# Patient Record
Sex: Female | Born: 2000 | Hispanic: No | Marital: Single | Smoking: Never smoker
Health system: Southern US, Community
[De-identification: ages and names within clinical notes are randomized; demographics above are authoritative.]

## PROBLEM LIST (undated history)

## (undated) DIAGNOSIS — Z0101 Encounter for examination of eyes and vision with abnormal findings: Secondary | ICD-10-CM

## (undated) DIAGNOSIS — Z7289 Other problems related to lifestyle: Secondary | ICD-10-CM

## (undated) DIAGNOSIS — J390 Retropharyngeal and parapharyngeal abscess: Secondary | ICD-10-CM

## (undated) HISTORY — DX: Retropharyngeal and parapharyngeal abscess: J39.0

## (undated) HISTORY — DX: Encounter for examination of eyes and vision with abnormal findings: Z01.01

## (undated) HISTORY — DX: Other problems related to lifestyle: Z72.89

---
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
## 2020-01-07 DIAGNOSIS — H5213 Myopia, bilateral: Secondary | ICD-10-CM | POA: Diagnosis not present

## 2020-04-21 ENCOUNTER — Ambulatory Visit: Payer: Medicaid Other | Admitting: Pediatrics

## 2020-06-16 ENCOUNTER — Encounter: Payer: Self-pay | Admitting: Pediatrics

## 2020-06-16 ENCOUNTER — Other Ambulatory Visit (HOSPITAL_COMMUNITY)
Admission: RE | Admit: 2020-06-16 | Discharge: 2020-06-16 | Disposition: A | Discharge status: Home / Self Care | Payer: PPO | Source: Ambulatory Visit | Attending: Pediatrics | Admitting: Pediatrics

## 2020-06-16 ENCOUNTER — Other Ambulatory Visit: Payer: Self-pay

## 2020-06-16 ENCOUNTER — Ambulatory Visit (INDEPENDENT_AMBULATORY_CARE_PROVIDER_SITE_OTHER): Payer: PPO | Admitting: Pediatrics

## 2020-06-16 VITALS — BP 110/78 | Ht 58.86 in | Wt 124.0 lb

## 2020-06-16 DIAGNOSIS — Z68.41 Body mass index (BMI) pediatric, 5th percentile to less than 85th percentile for age: Secondary | ICD-10-CM

## 2020-06-16 DIAGNOSIS — Z0001 Encounter for general adult medical examination with abnormal findings: Secondary | ICD-10-CM

## 2020-06-16 DIAGNOSIS — M542 Cervicalgia: Secondary | ICD-10-CM

## 2020-06-16 DIAGNOSIS — Z113 Encounter for screening for infections with a predominantly sexual mode of transmission: Secondary | ICD-10-CM

## 2020-06-16 LAB — POCT RAPID HIV: Rapid HIV, POC: NEGATIVE

## 2020-06-16 NOTE — Patient Instructions (Addendum)
Dental list         Updated 11.20.18 These dentists all accept Medicaid.  The list is a courtesy and for your convenience. Estos dentistas aceptan Medicaid.  La lista es para su [place] y es una cortesa.     [facility]     [phone] [address]  [address] [place] [address] Se habla espaol From 73 to 19 years old Parent may go with child only for cleaning Vinson Moselle DDS     [phone] Milus Banister, DDS (Spanish speaking) [address] [place]  [address] Se habla espaol From 52 to 53 years old Parent may go with child   Marolyn Hammock DMD    [phone] [address] [place] [address] Se habla espaol [place] spoken From 72 years old Parent may go with child [facility]     [phone] [address] [place] [address] Se habla espaol From 55 to 29 years old Parent may NOT go with child  Winfield Rast DDS  [phone] [facility]      [address]  [place] [address] Se habla espaol [place] spoken (preferred to bring translator) From teeth coming in to 62 years old Parent may go with child  [facility] Dept.     [phone] [address] [place] [address] Requires certification. Call for information. Requiere certificacin. Llame para informacin. Algunos dias se habla espaol  From birth to 20 years Parent possibly goes with child   Bradd Canary DDS     [phone] [address] [place]  [address] [place] [address] Se habla espaol From 18 months to 18 years  Parent may go with child  [facility] DDS     Garlon Hatchet DDS  [phone] [address] [place] [address] Se habla espaol From 41 year old Parent may go with child   Melynda Ripple DDS    [phone] [address] [place] [address] Se habla espaol  From 18 months to 83 years old Parent may go with child Dorian Pod DDS    [phone] [address] [place] [address] Se habla espaol From 34 to 62 years old Parent may go with child  [facility]    [phone] [address] [place] [address] No se Wayne Sever From birth [facility]  [phone] [address] [place] [address] Se habla espanol Interpretation for other languages Special needs children welcome  Geryl Councilman, DDS PA     [phone]  [place], [place] [address] From 19 years old   Special needs children welcome  [facility]   [phone] Dr. Orlean Patten [address] [place], [place] [address] Se habla espaol From birth to 12 years Special needs children welcome   [facility] [phone] [address] [place], [place] [address]   [facility] [phone] [address] [place], [place] [address]     Adult Primary Care Clinics Name Criteria Services   [facility]  Address: [address] [place], [place] [address]  Phone: [phone] Hours: Monday - Friday 9 AM -6 PM  Types of insurance accepted:   Commercial insurance  [place] [employer] (orange card)  DIRECTV  Uninsured  Language services:   Video and phone interpreters available   Ages 56 and older     Adult  primary care  Onsite pharmacy  Integrated behavioral health  Financial assistance counseling  Walk-in hours for established patients  Financial assistance counseling hours: Tuesdays 2:00PM - 5:00PM  Thursday 8:30AM - 4:30PM  Space is limited, 10 on Tuesday and 20 on Thursday on a first come, first serve basis  Name Criteria Services   [facility]  Address: [address] [place], [place] [address]  Phone: [phone]  Hours: Monday - Friday 8:30 AM - 5 PM  Types of insurance accepted:   Camera operator  Medicaid  Medicare  Uninsured  Language services:   Video and phone interpreters available   All ages - newborn to adult    Primary care for all ages (children and adults)  Integrated behavioral health  Nutritionist  Financial assistance counseling   Name Criteria Services   [facility]  Located on the ground floor of [facility]  Address: [address]  [place],  [place]  [address]  Phone: [phone]  Hours: Monday - Friday 8:15 AM - 5 PM  Types of insurance accepted:   Commercial insurance  Medicaid  Medicare  Uninsured  Language services:   Video and phone interpreters available   Ages 78 and older    Adult primary care  Nutritionist  Certified Diabetes Educator   Integrated behavioral health  Financial assistance counseling   Name Criteria Services   [facility]  Address: [address] [place], [place] [address]  Phone: [phone]  Hours: Monday - Friday 8:30 AM - 5 PM    Types of insurance accepted:   Nurse, learning disability  Medicaid  Medicare  Uninsured  Language services:   Video and phone interpreters available   All ages - newborn to adult    Primary care for all ages (children and adults)  Integrated behavioral health  Financial assistance counseling

## 2020-06-16 NOTE — Progress Notes (Signed)
Adolescent Well Care Visit Carrie Morgan is a 19 y.o. female who is here for well care.    PCP:  Clifton Custard, MD   History was provided by the patient.   Current Issues: Current concerns include : neck pain for the past several months - not worsening, pain is intermittent. It happens more when seated and doing school work or when playing on her phone.  No exercise currently.  Pain improves with flexion of the neck to stretch the back of her neck.  No history of injury.    Nutrition: Nutrition/Eating Behaviors: balanced diet Adequate calcium in diet?: milk Supplements/ Vitamins: none, sometimes MVI  Exercise/ Media: Play any Sports?/ Exercise: none  currently Screen Time:  > 2 hours-counseling provided Media Rules or Monitoring?: yes  Sleep:  Sleep: all night, no concerns, bedtime is 10-11 PM, wakes at 7 AM  Social Screening: Lives with:  Parents and younger sister Parental relations:  good Activities, Work, and Regulatory affairs officer?: baby sitting her younger sister Concerns regarding behavior with peers?  no Stressors of note: no  Education: School Name: [employer] - studying Psychology, did summer school at beginning of the summer School Grade: entering 2nd year  Menstruation:   LMP was at end of June Menstrual History: regular, lasts 3-4 days.   Confidential Social History: Tobacco?  no Secondhand smoke exposure?  no Drugs/ETOH?  no  Sexually Active?  no   Pregnancy Prevention: abstinence  Screenings: Patient has a dental home: no - list given today  The patient completed the Rapid Assessment for Adolescent Preventive Services screening questionnaire and the following topics were identified as risk factors and discussed: exercise and helmet use. In addition, the following topics were discussed as part of anticipatory guidance tobacco use, marijuana use, birth control, sexuality and screen time.  PHQ-9 completed and results indicated no signs of  depression - see flow sheet  Physical Exam:  Vitals:   06/16/20 0919  BP: 110/78  Weight: 124 lb (56.2 kg)  Height: 4' 10.86" (1.495 m)   BP 110/78 (BP Location: Right Arm, Patient Position: Sitting, Cuff Size: Normal)    Ht 4' 10.86" (1.495 m)    Wt 124 lb (56.2 kg)    BMI 25.17 kg/m  Body mass index: body mass index is 25.17 kg/m.    Hearing Screening   Method: Audiometry   125Hz  250Hz  500Hz  1000Hz  2000Hz  3000Hz  4000Hz  6000Hz  8000Hz   Right ear:   20 20 20  20     Left ear:   20 20 20  20       Visual Acuity Screening   Right eye Left eye Both eyes  Without correction:     With correction: 20/20 20/20 20/20     General Appearance:   alert, oriented, no acute distress and well nourished  HENT: Normocephalic, no obvious abnormality, conjunctiva clear  Mouth:   Normal appearing teeth, no obvious discoloration, dental caries, or dental caps  Neck:   Supple; thyroid: no enlargement, symmetric, no tenderness/mass/nodules, there is mild paraspinal tenderness, no midline point tenderness, full RO  Chest Tanner IV female - dense breast tissue, no discrete masses  Lungs:   Clear to auscultation bilaterally, normal work of breathing  Heart:   Regular rate and rhythm, S1 and S2 normal, no murmurs;   Abdomen:   Soft, non-tender, no mass, or organomegaly  GU normal female external genitalia, pelvic not performed, Tanner stage V  Musculoskeletal:   Tone and strength strong and symmetrical, all extremities  Lymphatic:   No cervical adenopathy  Skin/Hair/Nails:   Skin warm, dry and intact, no rashes, no bruises or petechiae  Neurologic:   Strength, gait, and coordination normal and age-appropriate     Assessment and Plan:   1. Encounter for general adult medical examination with abnormal findings  2. Routine screening for STI (sexually transmitted infection) Patient denies sexual activity - at risk age group. - Urine cytology ancillary only - POCT Rapid HIV -  negative  3. Neck pain, musculoskeletal Consistent with muscle fatigue due to posture when using phone/computer.  Discussed exercises, stretches, and position changes to help with this.  OK to use ibuprofen prn if needed.  Reviewed reasons to return to care.  BMI is appropriate for age  Hearing screening result:normal Vision screening result: normal   Return for 19 year old [facility] in 1 year - can be with adult provider or with me.Clifton Custard, MD

## 2020-06-17 LAB — URINE CYTOLOGY ANCILLARY ONLY
Chlamydia: NEGATIVE
Comment: NEGATIVE
Comment: NORMAL
Neisseria Gonorrhea: NEGATIVE

## 2020-12-19 DIAGNOSIS — H5213 Myopia, bilateral: Secondary | ICD-10-CM | POA: Diagnosis not present

## 2020-12-27 DIAGNOSIS — H5213 Myopia, bilateral: Secondary | ICD-10-CM | POA: Diagnosis not present

## 2021-11-08 ENCOUNTER — Encounter (HOSPITAL_COMMUNITY): Payer: Self-pay | Admitting: Emergency Medicine

## 2021-11-08 ENCOUNTER — Emergency Department (HOSPITAL_COMMUNITY)
Admission: EM | Admit: 2021-11-08 | Discharge: 2021-11-09 | Disposition: A | Discharge status: Home / Self Care | Payer: Medicaid Other | Attending: Physician Assistant | Admitting: Physician Assistant

## 2021-11-08 ENCOUNTER — Other Ambulatory Visit: Payer: Self-pay

## 2021-11-08 DIAGNOSIS — J029 Acute pharyngitis, unspecified: Secondary | ICD-10-CM | POA: Insufficient documentation

## 2021-11-08 DIAGNOSIS — H9202 Otalgia, left ear: Secondary | ICD-10-CM | POA: Diagnosis not present

## 2021-11-08 DIAGNOSIS — Z20822 Contact with and (suspected) exposure to covid-19: Secondary | ICD-10-CM | POA: Insufficient documentation

## 2021-11-08 DIAGNOSIS — J020 Streptococcal pharyngitis: Secondary | ICD-10-CM | POA: Diagnosis not present

## 2021-11-08 DIAGNOSIS — R000 Tachycardia, unspecified: Secondary | ICD-10-CM | POA: Diagnosis not present

## 2021-11-08 DIAGNOSIS — Z5321 Procedure and treatment not carried out due to patient leaving prior to being seen by health care provider: Secondary | ICD-10-CM | POA: Insufficient documentation

## 2021-11-08 LAB — RESP PANEL BY RT-PCR (FLU A&B, COVID) ARPGX2
Influenza A by PCR: NEGATIVE
Influenza B by PCR: NEGATIVE
SARS Coronavirus 2 by RT PCR: NEGATIVE

## 2021-11-08 NOTE — ED Provider Notes (Signed)
Emergency Medicine Provider Triage Evaluation Note  Carrie Morgan , a 20 y.o. female  was evaluated in triage.  Pt complains of sore throat and ear pain.  She denies any abdominal pain.  She does occasionally cough up phlegm but that is not new.  Times have been ongoing for 2 to 3 days.  He has been drinking Tylenol and "some medicine from [place]."  But she does not know what is in it.  Review of Systems  Positive: See above Negative:   Physical Exam  BP 110/66 (BP Location: Left Arm)    Pulse (!) 135    Temp (!) 100.4 F (38 C) (Oral)    Resp 18    SpO2 100%  Gen:   Awake, no distress   Resp:  Normal effort  MSK:   Moves extremities without difficulty  Other:  Normal phonation.  Uvula is midline.  Tonsils without enlargement.  No trismus or hoarse voice.  Serous effusions behind TMs bilaterally.   Medical Decision Making  Medically screening exam initiated at 7:23 PM.  Appropriate orders placed.  Carrie Morgan was informed that the remainder of the evaluation will be completed by another provider, this initial triage assessment does not replace that evaluation, and the importance of remaining in the ED until their evaluation is complete.  Patient reports no fevers at home, here she is febrile and slightly tachycardic.  She has been taking a medicine and she does not know what is in it I am hesitant to give her ibuprofen or Tylenol. Will obtain flu swab.  Without tonsillar exudates or enlargement doubt strep.  Note: Portions of this report may have been transcribed using voice recognition software. Every effort was made to ensure accuracy; however, inadvertent computerized transcription errors may be present    Norman Clay 11/08/21 1925    Arby Barrette, MD 11/11/21 [phone]

## 2021-11-08 NOTE — ED Notes (Signed)
Pt decided to leave 

## 2021-11-08 NOTE — ED Triage Notes (Signed)
Pt with sore throat and left ear pain x 2 days. No fever at home.

## 2021-11-09 ENCOUNTER — Telehealth (INDEPENDENT_AMBULATORY_CARE_PROVIDER_SITE_OTHER): Payer: Medicaid Other | Admitting: Nurse Practitioner

## 2021-11-09 ENCOUNTER — Encounter: Payer: Self-pay | Admitting: Nurse Practitioner

## 2021-11-09 DIAGNOSIS — J069 Acute upper respiratory infection, unspecified: Secondary | ICD-10-CM

## 2021-11-09 MED ORDER — AMOXICILLIN-POT CLAVULANATE 875-125 MG PO TABS: 1.0000 | ORAL_TABLET | Freq: Two times a day (BID) | ORAL | 0 refills | Status: DC

## 2021-11-09 MED ORDER — PREDNISONE 20 MG PO TABS: 20.0000 mg | ORAL_TABLET | Freq: Every day | ORAL | 0 refills | Status: AC

## 2021-11-09 NOTE — Patient Instructions (Addendum)
URI:   Stay well hydrated  Stay active  Deep breathing exercises  May take tylenol for fever or pain  May take mucinex twice daily  Will order Augmentin  Will order prednisone    Follow up:  Follow up in 2 weeks or sooner if needed

## 2021-11-09 NOTE — Progress Notes (Signed)
Virtual Visit via Telephone Note  I connected with Carrie Morgan on 11/09/21 at  9:20 AM EST by telephone and verified that I am speaking with the correct person using two identifiers.  Location: Patient: home Provider: office   I discussed the limitations, risks, security and privacy concerns of performing an evaluation and management service by telephone and the availability of in person appointments. I also discussed with the patient that there may be a patient responsible charge related to this service. The patient expressed understanding and agreed to proceed.   History of Present Illness:  Patient presents today for sick visit through telephone visit.  Patient was seen in the ED yesterday for the same issues.  She states that she has been having sore throat, left ear pain, swollen lymph nodes, low-grade fever.  Her temperature was 100.4 F in the ED yesterday.  She did have flu and strep swab completed which were both negative.  She was not swabbed for strep but the provider in the ED did make a note that her throat looked clear. Denies f/c/s, n/v/d, hemoptysis, PND, chest pain or edema.     Observations/Objective:  Vitals with BMI 11/08/2021 06/16/2020 02/03/2019  Height - 4' 10.858" 4' 10.268"  Weight - 124 lbs 117 lbs 8 oz  BMI - 25.17 24.33  Systolic 110 110 202  Diastolic 66 78 72  Pulse 135 - 78      Assessment and Plan:  URI:   Stay well hydrated  Stay active  Deep breathing exercises  May take tylenol for fever or pain  May take mucinex twice daily  Will order Augmentin  Will order prednisone    Follow up:  Follow up in 2 weeks or sooner if needed     I discussed the assessment and treatment plan with the patient. The patient was provided an opportunity to ask questions and all were answered. The patient agreed with the plan and demonstrated an understanding of the instructions.   The patient was advised to call back or seek an  in-person evaluation if the symptoms worsen or if the condition fails to improve as anticipated.  I provided 23 minutes of non-face-to-face time during this encounter.   Ivonne Andrew, NP

## 2021-11-13 NOTE — Progress Notes (Signed)
Patient ID: Kaytlen Lightsey, female    DOB: 14-May-2001  MRN: [id number]  CC: URI FOLLOW-UP  Subjective: Viona Hosking is a 20 y.o. female who presents for URI follow-up.   Her concerns today include:   1. URI FOLLOW-UP: 11/09/2021 with Angus Seller, NP: Stay well hydrated Stay active Deep breathing exercises May take tylenol for fever or pain May take mucinex twice daily Will order Augmentin Will order prednisone  11/21/2021: Doing well since recent appointment, back to normal, no issues/concerns.  2. VACCINE RECORDS: Attends school at [employer]. Planning to study abroad in [place] in Spring 2023. Requesting copy of vaccine records to be printed.   Patient Active Problem List   Diagnosis Date Noted   Neck pain, musculoskeletal 06/16/2020   Vitamin D deficiency 12/14/2016   Wears glasses 09/28/2014     Current Outpatient Medications on File Prior to Visit  Medication Sig Dispense Refill   amoxicillin-clavulanate (AUGMENTIN) 875-125 MG tablet Take 1 tablet by mouth 2 (two) times daily. 20 tablet 0   No current facility-administered medications on file prior to visit.    No Known Allergies  Social History   Socioeconomic History   Marital status: Single    Spouse name: Not on file   Number of children: Not on file   Years of education: Not on file   Highest education level: Not on file  Occupational History   Not on file  Tobacco Use   Smoking status: Never   Smokeless tobacco: Never  Vaping Use   Vaping Use: Never used  Substance and Sexual Activity   Alcohol use: No   Drug use: No   Sexual activity: Never  Other Topics Concern   Not on file  Social History Narrative   Lives with parents, 48 month old sister and uncle.  She is in the 7th grade.   Social Determinants of Health   Financial Resource Strain: Not on file  Food Insecurity: Not on file  Transportation Needs: Not on file  Physical Activity: Not on file   Stress: Not on file  Social Connections: Not on file  Intimate Partner Violence: Not on file    History reviewed. No pertinent family history.  Past Surgical History:  Procedure Laterality Date   INCISION AND DRAINAGE ABSCESS N/A 05/04/2014   Procedure: INCISION AND DRAINAGE RETROPHARYNGEAL ABSCESS;  Surgeon: Serena Colonel, MD;  Location: [facility];  Service: ENT;  Laterality: N/A;    ROS: Review of Systems Negative except as stated above  PHYSICAL EXAM: BP 119/81 (BP Location: Left Arm, Patient Position: Sitting, Cuff Size: Normal)    Pulse 84    Temp 98.3 F (36.8 C)    Resp 18    Ht 4' 10.9" (1.496 m)    Wt 115 lb 6.4 oz (52.3 kg)    SpO2 98%    BMI 23.39 kg/m   Physical Exam HENT:     Head: Normocephalic and atraumatic.  Eyes:     Extraocular Movements: Extraocular movements intact.     Conjunctiva/sclera: Conjunctivae normal.     Pupils: Pupils are equal, round, and reactive to light.  Cardiovascular:     Rate and Rhythm: Normal rate and regular rhythm.     Pulses: Normal pulses.     Heart sounds: Normal heart sounds.  Pulmonary:     Effort: Pulmonary effort is normal.     Breath sounds: Normal breath sounds.  Musculoskeletal:     Cervical back: Normal range of motion  and neck supple.  Neurological:     General: No focal deficit present.     Mental Status: She is alert and oriented to person, place, and time.  Psychiatric:        Mood and Affect: Mood normal.        Behavior: Behavior normal.   ASSESSMENT AND PLAN: 1. Upper respiratory tract infection, unspecified type: - Resolved.  2. Vaccine counseling: - Asked CMA to print patient copy of vaccines from [facility].  3. Need for Tdap vaccination: - Administered today in office. - Tdap vaccine greater than or equal to 7yo IM  4. Need for influenza vaccination: - Administered today in office. - Flu Vaccine QUAD 69mo+IM (Fluarix, Fluzone & Alfiuria Quad PF)    Patient was given the opportunity to  ask questions.  Patient verbalized understanding of the plan and was able to repeat key elements of the plan. Patient was given clear instructions to go to Emergency Department or return to medical center if symptoms don't improve, worsen, or new problems develop.The patient verbalized understanding.   Orders Placed This Encounter  Procedures   Tdap vaccine greater than or equal to 7yo IM   Flu Vaccine QUAD 30mo+IM (Fluarix, Fluzone & Alfiuria Quad PF)    Follow-up with primary provider as scheduled.  Rema Fendt, NP

## 2021-11-21 ENCOUNTER — Other Ambulatory Visit: Payer: Self-pay

## 2021-11-21 ENCOUNTER — Ambulatory Visit (INDEPENDENT_AMBULATORY_CARE_PROVIDER_SITE_OTHER): Payer: PPO | Admitting: Family

## 2021-11-21 ENCOUNTER — Encounter: Payer: Self-pay | Admitting: Family

## 2021-11-21 VITALS — BP 119/81 | HR 84 | Temp 98.3°F | Resp 18 | Ht 58.9 in | Wt 115.4 lb

## 2021-11-21 DIAGNOSIS — J069 Acute upper respiratory infection, unspecified: Secondary | ICD-10-CM | POA: Diagnosis not present

## 2021-11-21 DIAGNOSIS — Z7185 Encounter for immunization safety counseling: Secondary | ICD-10-CM

## 2021-11-21 DIAGNOSIS — Z23 Encounter for immunization: Secondary | ICD-10-CM

## 2021-11-21 NOTE — Patient Instructions (Signed)

## 2021-11-21 NOTE — Progress Notes (Signed)
Pr presents for follow-up from 12/15 visit, pt has completed course of antibiotics and doing fine, has not decided if she would like to establish care with [facility]

## 2023-01-07 NOTE — Progress Notes (Unsigned)
Patient ID: Haelee Shinners, female    DOB: 12/05/2000  MRN: [id number]  CC: Annual Physical Exam  Subjective: Eymi Corriveau is a 22 y.o. female who presents for annual physical exam.   Her concerns today include:  Pap    Patient Active Problem List   Diagnosis Date Noted   Neck pain, musculoskeletal 06/16/2020   Vitamin D deficiency 12/14/2016   Wears glasses 09/28/2014     Current Outpatient Medications on File Prior to Visit  Medication Sig Dispense Refill   amoxicillin-clavulanate (AUGMENTIN) 875-125 MG tablet Take 1 tablet by mouth 2 (two) times daily. 20 tablet 0   No current facility-administered medications on file prior to visit.    No Known Allergies  Social History   Socioeconomic History   Marital status: Single    Spouse name: Not on file   Number of children: Not on file   Years of education: Not on file   Highest education level: Not on file  Occupational History   Not on file  Tobacco Use   Smoking status: Never   Smokeless tobacco: Never  Vaping Use   Vaping Use: Never used  Substance and Sexual Activity   Alcohol use: No   Drug use: No   Sexual activity: Never  Other Topics Concern   Not on file  Social History Narrative   Lives with parents, 49 month old sister and uncle.  She is in the 7th grade.   Social Determinants of Health   Financial Resource Strain: Not on file  Food Insecurity: No Food Insecurity (02/03/2019)   Hunger Vital Sign    Worried About Running Out of Food in the Last Year: Never true    Ran Out of Food in the Last Year: Never true  Transportation Needs: Not on file  Physical Activity: Not on file  Stress: Not on file  Social Connections: Not on file  Intimate Partner Violence: Not on file    No family history on file.  Past Surgical History:  Procedure Laterality Date   INCISION AND DRAINAGE ABSCESS N/A 05/04/2014   Procedure: INCISION AND DRAINAGE RETROPHARYNGEAL ABSCESS;  Surgeon:  Izora Gala, MD;  Location: [place];  Service: ENT;  Laterality: N/A;    ROS: Review of Systems Negative except as stated above  PHYSICAL EXAM: There were no vitals taken for this visit.  Physical Exam  {female adult master:310786} {female adult master:310785}     Latest Ref Rng & Units 03/02/2016    6:01 PM 10/10/2015    4:27 PM 05/03/2014    4:25 PM  CMP  Glucose 65 - 99 mg/dL 115  86  102   BUN 6 - 20 mg/dL 8  9  10   $ Creatinine 0.50 - 1.00 mg/dL 0.64  0.52  0.66   Sodium 135 - 145 mmol/L 139  138  137   Potassium 3.5 - 5.1 mmol/L 3.4  4.3  3.6   Chloride 101 - 111 mmol/L 101  103  96   CO2 22 - 32 mmol/L 23  25  22   $ Calcium 8.9 - 10.3 mg/dL 9.5  9.4  9.4   Total Protein 6.3 - 8.2 g/dL  7.1  8.1   Total Bilirubin 0.2 - 1.1 mg/dL  0.4  0.5   Alkaline Phos 41 - 244 U/L  108  154   AST 12 - 32 U/L  19  21   ALT 6 - 19 U/L  11  13  Lipid Panel     Component Value Date/Time   CHOL 151 10/10/2015 1627   TRIG 81 10/10/2015 1627   HDL 56 10/10/2015 1627   CHOLHDL 2.7 10/10/2015 1627   VLDL 16 10/10/2015 1627   LDLCALC 79 10/10/2015 1627    CBC    Component Value Date/Time   WBC 8.6 12/14/2016 1213   RBC 4.38 12/14/2016 1213   HGB 13.0 08/30/2017 1538   HGB 13.9 12/14/2016 1213   HCT 41.9 12/14/2016 1213   PLT 264 12/14/2016 1213   MCV 95.7 12/14/2016 1213   MCH 31.7 12/14/2016 1213   MCHC 33.2 12/14/2016 1213   RDW 13.6 12/14/2016 1213   LYMPHSABS 2.6 03/02/2016 1801   MONOABS 0.5 03/02/2016 1801   EOSABS 0.0 03/02/2016 1801   BASOSABS 0.0 03/02/2016 1801    ASSESSMENT AND PLAN:  There are no diagnoses linked to this encounter.   Patient was given the opportunity to ask questions.  Patient verbalized understanding of the plan and was able to repeat key elements of the plan. Patient was given clear instructions to go to Emergency Department or return to medical center if symptoms don't improve, worsen, or new problems develop.The patient verbalized  understanding.   No orders of the defined types were placed in this encounter.    Requested Prescriptions    No prescriptions requested or ordered in this encounter    No follow-ups on file.  Camillia Herter, NP

## 2023-01-09 ENCOUNTER — Ambulatory Visit (INDEPENDENT_AMBULATORY_CARE_PROVIDER_SITE_OTHER): Payer: Medicaid Other | Admitting: Family

## 2023-01-09 ENCOUNTER — Other Ambulatory Visit (HOSPITAL_COMMUNITY)
Admission: RE | Admit: 2023-01-09 | Discharge: 2023-01-09 | Disposition: A | Discharge status: Home / Self Care | Payer: Medicaid Other | Source: Ambulatory Visit | Attending: Family | Admitting: Family

## 2023-01-09 ENCOUNTER — Encounter: Payer: Self-pay | Admitting: Family

## 2023-01-09 VITALS — BP 123/83 | HR 107 | Temp 98.3°F | Resp 16 | Ht 58.9 in | Wt 115.0 lb

## 2023-01-09 DIAGNOSIS — Z23 Encounter for immunization: Secondary | ICD-10-CM

## 2023-01-09 DIAGNOSIS — Z130 Encounter for screening for diseases of the blood and blood-forming organs and certain disorders involving the immune mechanism: Secondary | ICD-10-CM

## 2023-01-09 DIAGNOSIS — Z0000 Encounter for general adult medical examination without abnormal findings: Secondary | ICD-10-CM | POA: Diagnosis not present

## 2023-01-09 DIAGNOSIS — Z131 Encounter for screening for diabetes mellitus: Secondary | ICD-10-CM

## 2023-01-09 DIAGNOSIS — Z113 Encounter for screening for infections with a predominantly sexual mode of transmission: Secondary | ICD-10-CM

## 2023-01-09 DIAGNOSIS — Z1329 Encounter for screening for other suspected endocrine disorder: Secondary | ICD-10-CM

## 2023-01-09 DIAGNOSIS — Z13228 Encounter for screening for other metabolic disorders: Secondary | ICD-10-CM

## 2023-01-09 DIAGNOSIS — Z124 Encounter for screening for malignant neoplasm of cervix: Secondary | ICD-10-CM

## 2023-01-09 DIAGNOSIS — Z1159 Encounter for screening for other viral diseases: Secondary | ICD-10-CM

## 2023-01-09 DIAGNOSIS — Z13220 Encounter for screening for lipoid disorders: Secondary | ICD-10-CM

## 2023-01-09 NOTE — Progress Notes (Signed)
.  Pt presents for annual physical exam w/pap 

## 2023-01-09 NOTE — Patient Instructions (Signed)

## 2023-01-10 ENCOUNTER — Other Ambulatory Visit: Payer: Self-pay | Admitting: Family

## 2023-01-10 DIAGNOSIS — Z13220 Encounter for screening for lipoid disorders: Secondary | ICD-10-CM

## 2023-01-10 LAB — CBC
Hematocrit: 42.5 % (ref 34.0–46.6)
Hemoglobin: 14.3 g/dL (ref 11.1–15.9)
MCH: 32.6 pg (ref 26.6–33.0)
MCHC: 33.6 g/dL (ref 31.5–35.7)
MCV: 97 fL (ref 79–97)
Platelets: 235 10*3/uL (ref 150–450)
RBC: 4.39 x10E6/uL (ref 3.77–5.28)
RDW: 12.9 % (ref 11.7–15.4)
WBC: 9.9 10*3/uL (ref 3.4–10.8)

## 2023-01-10 LAB — CERVICOVAGINAL ANCILLARY ONLY
Bacterial Vaginitis (gardnerella): NEGATIVE
Candida Glabrata: NEGATIVE
Candida Vaginitis: NEGATIVE
Chlamydia: NEGATIVE
Comment: NEGATIVE
Comment: NEGATIVE
Comment: NEGATIVE
Comment: NEGATIVE
Comment: NEGATIVE
Comment: NORMAL
Neisseria Gonorrhea: NEGATIVE
Trichomonas: NEGATIVE

## 2023-01-10 LAB — LIPID PANEL
Chol/HDL Ratio: 3.2 ratio (ref 0.0–4.4)
Cholesterol, Total: 168 mg/dL (ref 100–199)
HDL: 52 mg/dL (ref >39)
LDL Chol Calc (NIH): 86 mg/dL (ref 0–99)
Triglycerides: 175 mg/dL — ABNORMAL HIGH (ref 0–149)
VLDL Cholesterol Cal: 30 mg/dL (ref 5–40)

## 2023-01-10 LAB — HEPATITIS C ANTIBODY: Hep C Virus Ab: NONREACTIVE

## 2023-01-10 LAB — HEMOGLOBIN A1C
Est. average glucose Bld gHb Est-mCnc: 105 mg/dL
Hgb A1c MFr Bld: 5.3 % (ref 4.8–5.6)

## 2023-01-10 LAB — TSH: TSH: 3.53 u[IU]/mL (ref 0.450–4.500)

## 2023-01-11 ENCOUNTER — Encounter: Payer: Self-pay | Admitting: Family

## 2023-01-11 LAB — CYTOLOGY - PAP: Diagnosis: NEGATIVE

## 2023-02-07 ENCOUNTER — Encounter: Payer: Self-pay | Admitting: Family

## 2023-02-07 ENCOUNTER — Ambulatory Visit (INDEPENDENT_AMBULATORY_CARE_PROVIDER_SITE_OTHER): Payer: Medicaid Other | Admitting: Family

## 2023-02-07 VITALS — BP 118/77 | HR 127 | Temp 101.6°F | Resp 16 | Ht 58.9 in | Wt 115.0 lb

## 2023-02-07 DIAGNOSIS — J020 Streptococcal pharyngitis: Secondary | ICD-10-CM | POA: Diagnosis not present

## 2023-02-07 LAB — POCT RAPID STREP A (OFFICE): Rapid Strep A Screen: POSITIVE — AB

## 2023-02-07 MED ORDER — AMOXICILLIN 500 MG PO CAPS: 500.0000 mg | ORAL_CAPSULE | Freq: Two times a day (BID) | ORAL | 0 refills | Status: AC

## 2023-02-07 NOTE — Patient Instructions (Addendum)
Strep Throat, Adult Strep throat is an infection of the throat. It is caused by germs (bacteria). Strep throat is common during the cold months of the year. It mostly affects children who are 5-22 years old. However, people of all ages can get it at any time of the year. This infection spreads from person to person through coughing, sneezing, or having close contact. What are the causes? This condition is caused by the Streptococcus pyogenes germ. What increases the risk? You care for young children. Children are more likely to get strep throat and may spread it to others. You go to crowded places. Germs can spread easily in such places. You kiss or touch someone who has strep throat. What are the signs or symptoms? Fever or chills. Redness, swelling, or pain in the tonsils or throat. Pain or trouble when swallowing. White or yellow spots on the tonsils or throat. Tender glands in the neck and under the jaw. Bad breath. Red rash all over the body. This is rare. How is this treated? Medicines that kill germs (antibiotics). Medicines that treat pain or fever. These include: Ibuprofen or acetaminophen. Aspirin, only for people who are over the age of 18. Cough drops. Throat sprays. Follow these instructions at home: Medicines  Take over-the-counter and prescription medicines only as told by your doctor. Take your antibiotic medicine as told by your doctor. Do not stop taking the antibiotic even if you start to feel better. Eating and drinking  If you have trouble swallowing, eat soft foods until your throat feels better. Drink enough fluid to keep your pee (urine) pale yellow. To help with pain, you may have: Warm fluids, such as soup and tea. Cold fluids, such as frozen desserts or popsicles. General instructions Rinse your mouth (gargle) with a salt-water mixture 3-4 times a day or as needed. To make a salt-water mixture, dissolve -1 tsp (3-6 g) of salt in 1 cup (237 mL) of warm  water. Rest as much as you can. Stay home from work or school until you have been taking antibiotics for 24 hours. Do not smoke or use any products that contain nicotine or tobacco. If you need help quitting, ask your doctor. Keep all follow-up visits. How is this prevented?  Do not share food, drinking cups, or personal items. They can cause the germs to spread. Wash your hands well with soap and water. Make sure that all people in your house wash their hands well. Have family members tested if they have a fever or a sore throat. They may need an antibiotic if they have strep throat. Contact a doctor if: You have swelling in your neck that keeps getting bigger. You get a rash, cough, or earache. You cough up a thick fluid that is green, yellow-brown, or bloody. You have pain that does not get better with medicine. Your symptoms get worse instead of getting better. You have a fever. Get help right away if: You vomit. You have a very bad headache. Your neck hurts or feels stiff. You have chest pain or are short of breath. You have drooling, very bad throat pain, or changes in your voice. Your neck is swollen, or the skin gets red and tender. Your mouth is dry, or you are peeing less than normal. You keep feeling more tired or have trouble waking up. Your joints are red or painful. These symptoms may be an emergency. Do not wait to see if the symptoms will go away. Get help right away. Call   your local emergency services (911 in the [place] Summary Strep throat is an infection of the throat. It is caused by germs (bacteria). This infection can spread from person to person through coughing, sneezing, or having close contact. Take your medicines, including antibiotics, as told by your doctor. Do not stop taking the antibiotic even if you start to feel better. To prevent the spread of germs, wash your hands well with soap and water. Have others do the same. Do not share food, drinking cups,  or personal items. Get help right away if you have a bad headache, chest pain, shortness of breath, a stiff or painful neck, or you vomit. This information is not intended to replace advice given to you by your health care provider. Make sure you discuss any questions you have with your health care provider. Document Revised: 03/07/2021 Document Reviewed: 03/07/2021 Elsevier Patient Education  2023 Elsevier Inc.  

## 2023-02-07 NOTE — Progress Notes (Signed)
Pt presents for sore throat and ear pain -ear pain in left -sore throat started Monday

## 2023-02-07 NOTE — Progress Notes (Signed)
Patient ID: Carrie Morgan, female    DOB: 2001/05/11  MRN: [id number]  CC: Sore Throat   Subjective: Jeileen Rajan is a 22 y.o. female who presents for sore throat.   Her concerns today include:  Sore throat and ear pain x 3 days. She denies associated red flag symptoms. No further issues/concerns for discussion today.    Patient Active Problem List   Diagnosis Date Noted   Neck pain, musculoskeletal 06/16/2020   Vitamin D deficiency 12/14/2016   Wears glasses 09/28/2014     No current outpatient medications on file prior to visit.   No current facility-administered medications on file prior to visit.    No Known Allergies  Social History   Socioeconomic History   Marital status: Single    Spouse name: Not on file   Number of children: Not on file   Years of education: Not on file   Highest education level: Not on file  Occupational History   Not on file  Tobacco Use   Smoking status: Never    Passive exposure: Never   Smokeless tobacco: Never  Vaping Use   Vaping Use: Never used  Substance and Sexual Activity   Alcohol use: No   Drug use: No   Sexual activity: Never  Other Topics Concern   Not on file  Social History Narrative   Lives with parents, 34 month old sister and uncle.  She is in the 7th grade.   Social Determinants of Health   Financial Resource Strain: Not on file  Food Insecurity: No Food Insecurity (02/03/2019)   Hunger Vital Sign    Worried About Running Out of Food in the Last Year: Never true    Ran Out of Food in the Last Year: Never true  Transportation Needs: Not on file  Physical Activity: Not on file  Stress: Not on file  Social Connections: Not on file  Intimate Partner Violence: Not on file    No family history on file.  Past Surgical History:  Procedure Laterality Date   INCISION AND DRAINAGE ABSCESS N/A 05/04/2014   Procedure: INCISION AND DRAINAGE RETROPHARYNGEAL ABSCESS;  Surgeon: Izora Gala, MD;  Location: [facility];  Service: ENT;  Laterality: N/A;    ROS: Review of Systems Negative except as stated above  PHYSICAL EXAM: BP 118/77 (BP Location: Left Arm, Patient Position: Sitting, Cuff Size: Normal)   Pulse (!) 127   Temp (!) 101.6 F (38.7 C)   Resp 16   Ht 4' 10.9" (1.496 m)   Wt 115 lb (52.2 kg)   SpO2 97%   BMI 23.31 kg/m   Physical Exam HENT:     Head: Normocephalic and atraumatic.     Right Ear: Tympanic membrane and ear canal normal.     Left Ear: Tympanic membrane and ear canal normal.     Mouth/Throat:     Mouth: Mucous membranes are moist.     Pharynx: Oropharynx is clear. Posterior oropharyngeal erythema present.  Eyes:     Conjunctiva/sclera: Conjunctivae normal.     Pupils: Pupils are equal, round, and reactive to light.  Cardiovascular:     Rate and Rhythm: Tachycardia present.     Pulses: Normal pulses.     Heart sounds: Normal heart sounds.  Pulmonary:     Effort: Pulmonary effort is normal.     Breath sounds: Normal breath sounds.  Musculoskeletal:     Cervical back: Normal range of motion and neck supple.  Skin:    General: Skin is warm and dry.  Neurological:     General: No focal deficit present.     Mental Status: She is alert and oriented to person, place, and time.  Psychiatric:        Mood and Affect: Mood normal.        Behavior: Behavior normal.     Results for orders placed or performed in visit on 02/07/23  POCT rapid strep A  Result Value Ref Range   Rapid Strep A Screen Positive (A) Negative     ASSESSMENT AND PLAN: 1. Strep throat - Patient today in office with no acute distress.  - Routine screening.  - Positive rapid Strep A. - Amoxicillin as prescribed. Counseled on medication adherence.  - Discussed with patient conservative measures.  - Follow-up with primary provider as scheduled.  - COVID-19, Flu A+B and RSV - POCT rapid strep A - amoxicillin (AMOXIL) 500 MG capsule; Take 1 capsule (500 mg total)  by mouth 2 (two) times daily for 10 days.  Dispense: 20 capsule; Refill: 0    Patient was given the opportunity to ask questions.  Patient verbalized understanding of the plan and was able to repeat key elements of the plan. Patient was given clear instructions to go to Emergency Department or return to medical center if symptoms don't improve, worsen, or new problems develop.The patient verbalized understanding.   Orders Placed This Encounter  Procedures   COVID-19, Flu A+B and RSV   POCT rapid strep A     Requested Prescriptions   Signed Prescriptions Disp Refills   amoxicillin (AMOXIL) 500 MG capsule 20 capsule 0    Sig: Take 1 capsule (500 mg total) by mouth 2 (two) times daily for 10 days.    Follow-up with primary provider as scheduled.   Camillia Herter, NP

## 2023-02-08 LAB — COVID-19, FLU A+B AND RSV
Influenza A, NAA: NOT DETECTED
Influenza B, NAA: NOT DETECTED
RSV, NAA: NOT DETECTED
SARS-CoV-2, NAA: NOT DETECTED

## 2023-10-14 ENCOUNTER — Ambulatory Visit: Payer: Self-pay | Admitting: *Deleted
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
## 2023-10-19 ENCOUNTER — Encounter (HOSPITAL_BASED_OUTPATIENT_CLINIC_OR_DEPARTMENT_OTHER): Payer: Self-pay

## 2023-10-19 ENCOUNTER — Emergency Department (HOSPITAL_BASED_OUTPATIENT_CLINIC_OR_DEPARTMENT_OTHER)
Admission: EM | Admit: 2023-10-19 | Discharge: 2023-10-19 | Disposition: A | Discharge status: Home / Self Care | Payer: Medicaid Other | Attending: Emergency Medicine | Admitting: Emergency Medicine

## 2023-10-19 DIAGNOSIS — R202 Paresthesia of skin: Secondary | ICD-10-CM

## 2023-10-19 DIAGNOSIS — R42 Dizziness and giddiness: Secondary | ICD-10-CM | POA: Diagnosis present

## 2023-10-19 LAB — BASIC METABOLIC PANEL
Anion gap: 9 (ref 5–15)
BUN: 7 mg/dL (ref 6–20)
CO2: 25 mmol/L (ref 22–32)
Calcium: 9.4 mg/dL (ref 8.9–10.3)
Chloride: 103 mmol/L (ref 98–111)
Creatinine, Ser: 0.57 mg/dL (ref 0.44–1.00)
GFR, Estimated: >60 mL/min (ref >60)
Glucose, Bld: 94 mg/dL (ref 70–99)
Potassium: 3.5 mmol/L (ref 3.5–5.1)
Sodium: 137 mmol/L (ref 135–145)

## 2023-10-19 LAB — CBC
HCT: 39.1 % (ref 36.0–46.0)
Hemoglobin: 13.1 g/dL (ref 12.0–15.0)
MCH: 32 pg (ref 26.0–34.0)
MCHC: 33.5 g/dL (ref 30.0–36.0)
MCV: 95.4 fL (ref 80.0–100.0)
Platelets: 225 10*3/uL (ref 150–400)
RBC: 4.1 MIL/uL (ref 3.87–5.11)
RDW: 12.8 % (ref 11.5–15.5)
WBC: 9.8 10*3/uL (ref 4.0–10.5)
nRBC: 0 % (ref 0.0–0.2)

## 2023-10-19 LAB — URINALYSIS, ROUTINE W REFLEX MICROSCOPIC
Bilirubin Urine: NEGATIVE
Glucose, UA: NEGATIVE mg/dL
Hgb urine dipstick: NEGATIVE
Ketones, ur: 15 mg/dL — AB
Leukocytes,Ua: NEGATIVE
Nitrite: NEGATIVE
Protein, ur: NEGATIVE mg/dL
Specific Gravity, Urine: 1.013 (ref 1.005–1.030)
pH: 6.5 (ref 5.0–8.0)

## 2023-10-19 LAB — PREGNANCY, URINE: Preg Test, Ur: NEGATIVE

## 2023-10-19 LAB — CBG MONITORING, ED: Glucose-Capillary: 93 mg/dL (ref 70–99)

## 2023-10-19 NOTE — Discharge Instructions (Signed)
Please drink plenty of fluids, and follow-up closely with your primary care doctor if any of your symptoms continue.  Your workup today was very reassuring.
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
## 2023-11-18 ENCOUNTER — Ambulatory Visit: Payer: Self-pay

## 2023-11-18 NOTE — Telephone Encounter (Signed)
  Chief Complaint: sore throat Symptoms: sore throat on L side, cough, runny nose Frequency: since last Tuesday  Pertinent Negatives: Patient denies fever Disposition: [] ED /[] Urgent Care (no appt availability in office) / [] Appointment(In office/virtual)/ []  [facility]/ [] Home Care/ [] Refused Recommended Disposition /[x] [facility]/ []  Follow-up with PCP Additional Notes: pt has been taking Tylenol for pain, offered OV today at 4 with PCP but pt unable to come in, advised [facility] tomorrow and provided location details. Pt will try to go there tomorrow.   Summary: Sore throat - pain on left side   Pt seeking appointment, no available appointments. Pt has a sore throat, left side of throat. Pt feels it is a bit inflamed on left side. Pt also states there is pain when touching left side of throat- rates pain about a 4. Sx started last Tuesday. Pt also has a cough.  Pt seeking clinical advice.         Reason for Disposition  [1] Sore throat with cough/cold symptoms AND [2] present > 5 days  Answer Assessment - Initial Assessment Questions 1. ONSET: "When did the throat start hurting?" (Hours or days ago)      Last Tuesday  2. SEVERITY: "How bad is the sore throat?" (Scale 1-10; mild, moderate or severe)   - MILD (1-3):  Doesn't interfere with eating or normal activities.   - MODERATE (4-7): Interferes with eating some solids and normal activities.   - SEVERE (8-10):  Excruciating pain, interferes with most normal activities.   - SEVERE WITH DYSPHAGIA (10): Can't swallow liquids, drooling.     Mild to moderate  4.  VIRAL SYMPTOMS: "Are there any symptoms of a cold, such as a runny nose, cough, hoarse voice or red eyes?"      Runny nose and cough  5. FEVER: "Do you have a fever?" If Yes, ask: "What is your temperature, how was it measured, and when did it start?"     no 6. PUS ON THE TONSILS: "Is there pus on the tonsils in the back of your throat?"     no 7. OTHER  SYMPTOMS: "Do you have any other symptoms?" (e.g., difficulty breathing, headache, rash)     Tenderness on L side of throat  Protocols used: Sore Throat-A-AH

## 2023-11-18 NOTE — Telephone Encounter (Signed)
Summary: Sore throat - pain on left side   Pt seeking appointment, no available appointments. Pt has a sore throat, left side of throat. Pt feels it is a bit inflamed on left side. Pt also states there is pain when touching left side of throat- rates pain about a 4. Sx started last Tuesday. Pt also has a cough.  Pt seeking clinical advice.     Left message to call back.

## 2023-11-21 ENCOUNTER — Ambulatory Visit: Payer: Self-pay

## 2023-11-21 NOTE — Telephone Encounter (Signed)
Message from [place] sent at 11/21/2023  2:15 PM EST  Summary: sore throat and cough.   Pt stated she has a sore throat and cough. Seeking clinical advice.  No appointments.         Chief Complaint: sore swollen lymph node, sore throat Symptoms: post nasal drip, runny nose, cough with clear phlegm Frequency: last Tuesday Pertinent Negatives: Patient denies fever, SOB, white patches, wheezing Disposition: [] ED /[x] Urgent Care (no appt availability in office) / [] Appointment(In office/virtual)/ []  [facility]/ [] Home Care/ [] Refused Recommended Disposition /[] [facility]/ []  Follow-up with PCP Additional Notes: no appt in office. Concern since lymph node is sore and initially affected her swallow. Has h/o step throat in past.  No appts  Reason for Disposition  Cough with cold symptoms (e.g., runny nose, postnasal drip, throat clearing)  [1] Tender node in the neck AND [2] also has a sore throat AND [3] minimal/no runny nose or cough  Answer Assessment - Initial Assessment Questions 1. ONSET: "When did the cough begin?"      Last Tuesday 2. SEVERITY: "How bad is the cough today?"      Frequent  3. SPUTUM: "Describe the color of your sputum" (none, dry cough; clear, white, yellow, green)     Clear phlegm  5. DIFFICULTY BREATHING: "Are you having difficulty breathing?" If Yes, ask: "How bad is it?" (e.g., mild, moderate, severe)    - MILD: No SOB at rest, mild SOB with walking, speaks normally in sentences, can lie down, no retractions, pulse < 100.    - MODERATE: SOB at rest, SOB with minimal exertion and prefers to sit, cannot lie down flat, speaks in phrases, mild retractions, audible wheezing, pulse 100-120.    - SEVERE: Very SOB at rest, speaks in single words, struggling to breathe, sitting hunched forward, retractions, pulse > 120      No SOB 6. FEVER: "Do you have a fever?" If Yes, ask: "What is your temperature, how was it measured, and when did it  start?"     no 10. OTHER SYMPTOMS: "Do you have any other symptoms?" (e.g., runny nose, wheezing, chest pain)       Runny nose, sore throat from the outside, runny nose  Answer Assessment - Initial Assessment Questions 1. LOCATION: "Where is the swollen node located?" "Is the matching node on the other side of the body also swollen?"      Top of neck 2. SIZE: "How big is the node?" (e.g., inches or centimeters; or compared to common objects such as pea, bean, marble, golf ball)      Could describe  3. ONSET: "When did the swelling start?"      Last Tuesday  4. NECK NODES: "Is there a sore throat, runny nose or other symptoms of a cold?"      Runny nose, sore throat, post nasal drip 6. FEVER: "Do you have a fever?" If Yes, ask: "What is it, how was it measured, and when did it start?"      no 7. CAUSE: "What do you think is causing the swollen lymph nodes?"     ? strep 8. OTHER SYMPTOMS: "Do you have any other symptoms?"     When she initially noticed it it was impairing her swallow is tender  Protocols used: Cough - Acute Non-Productive-A-AH, Lymph Nodes - Swollen-A-AH

## 2023-11-22 ENCOUNTER — Other Ambulatory Visit: Payer: Self-pay

## 2023-11-22 ENCOUNTER — Encounter: Payer: Self-pay | Admitting: *Deleted

## 2023-11-22 ENCOUNTER — Ambulatory Visit
Admission: EM | Admit: 2023-11-22 | Discharge: 2023-11-22 | Disposition: A | Discharge status: Home / Self Care | Payer: Medicaid Other | Attending: Physician Assistant | Admitting: Physician Assistant

## 2023-11-22 DIAGNOSIS — J020 Streptococcal pharyngitis: Secondary | ICD-10-CM

## 2023-11-22 DIAGNOSIS — R591 Generalized enlarged lymph nodes: Secondary | ICD-10-CM | POA: Diagnosis not present

## 2023-11-22 LAB — POCT MONO SCREEN (KUC): Mono, POC: NEGATIVE

## 2023-11-22 LAB — POCT RAPID STREP A (OFFICE): Rapid Strep A Screen: POSITIVE — AB

## 2023-11-22 MED ORDER — AMOXICILLIN 500 MG PO CAPS: 500.0000 mg | ORAL_CAPSULE | Freq: Two times a day (BID) | ORAL | 0 refills | Status: DC

## 2023-11-22 NOTE — ED Triage Notes (Signed)
Cough and sore throat x 1 week. Lump on throat also, which hurts when she turns her head from side to side

## 2023-11-22 NOTE — Discharge Instructions (Signed)
You tested positive for strep pharyngitis.  Start amoxicillin twice daily as prescribed.  Use over-the-counter medications including Tylenol and ibuprofen for pain.  Gargle with warm salt water.  Throw your toothbrush a few days after starting medication to prevent reinfection.  Follow-up with primary care if symptoms do not improve significantly in the next couple of days.  You are contagious for 24 hours after starting medication so I have provided an excuse note.  If you have any worsening symptoms including high fever, difficulty swallowing, swelling of your throat, shortness of breath, muffled voice you need to be seen immediately.  

## 2023-11-22 NOTE — ED Provider Notes (Signed)
[facility]    CSN: [id number] Arrival date & time: 11/22/23  1314      History   Chief Complaint Chief Complaint  Patient presents with   Sore Throat    HPI Carrie Morgan is a 22 y.o. female.   Patient presents today with a weeklong history of URI symptoms including cough and sore throat.  She also reports some nasal congestion and sinus drainage.  She denies any known sick contacts; reports her sister is sick but she was sick first.  Over the past several days she has developed a lump that is painful in her left anterior neck.  Denies any history of thyroid condition.  Denies any weight loss, tremor, diarrhea.  She has not tried any over-the-counter medication for symptom management.  Reports that she was initially seen by a different urgent care and was told that she should take Mucinex which she has been doing without improvement.  Denies any recent antibiotics or steroids.  She has no concern for pregnancy.  She does have a primary care but was unable to see them as they did not have availability for an acute complaint.    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      Home Medications    Prior to Admission medications   Medication Sig Start Date End Date Taking? Authorizing Provider  amoxicillin (AMOXIL) 500 MG capsule Take 1 capsule (500 mg total) by mouth 2 (two)  times daily. 11/22/23  Yes Isaid Salvia, Noberto Retort, PA-C    Family History History reviewed. No pertinent family history.  Social History Social History   Tobacco Use   Smoking status: Never    Passive exposure: Never   Smokeless tobacco: Never  Vaping Use   Vaping status: Never Used  Substance Use Topics   Alcohol use: No   Drug use: No     Allergies   Patient has no known allergies.   Review of Systems Review of Systems  Constitutional:  Positive for activity change and chills. Negative for appetite change, fatigue and fever.  HENT:  Positive for congestion and sore throat. Negative for sinus pressure, sneezing, trouble swallowing and voice change.   Respiratory:  Positive for cough. Negative for shortness of breath.   Cardiovascular:  Negative for chest pain.  Gastrointestinal:  Negative for abdominal pain, diarrhea, nausea and vomiting.  Neurological:  Negative for dizziness, light-headedness and headaches.  Hematological:  Positive for adenopathy.     Physical Exam Triage Vital Signs ED Triage Vitals  Encounter Vitals Group     BP 11/22/23 1502 122/77     Systolic BP Percentile --      Diastolic BP Percentile --      Pulse Rate 11/22/23 1502 86     Resp 11/22/23 1502 16     Temp 11/22/23 1502 98.8 F (37.1 C)     Temp Source 11/22/23  1502 Oral     SpO2 11/22/23 1502 100 %     Weight --      Height --      Head Circumference --      Peak Flow --      Pain Score 11/22/23 1505 4     Pain Loc --      Pain Education --      Exclude from Growth Chart --    No data found.  Updated Vital Signs BP 122/77 (BP Location: Left Arm)   Pulse 86   Temp 98.8 F (37.1 C) (Oral)   Resp 16   LMP 11/09/2023   SpO2 100%   Visual Acuity Right Eye Distance:   Left Eye Distance:   Bilateral Distance:    Right Eye Near:   Left Eye Near:    Bilateral Near:     Physical Exam Vitals reviewed.  Constitutional:      General: She is awake. She is not in acute distress.     Appearance: Normal appearance. She is well-developed. She is not ill-appearing.     Comments: Very pleasant female appears stated age in no acute distress sitting comfortably in exam room  HENT:     Head: Normocephalic and atraumatic.     Right Ear: Tympanic membrane, ear canal and external ear normal. Tympanic membrane is not erythematous or bulging.     Left Ear: Tympanic membrane, ear canal and external ear normal. Tympanic membrane is not erythematous or bulging.     Nose:     Right Sinus: Maxillary sinus tenderness present. No frontal sinus tenderness.     Left Sinus: Maxillary sinus tenderness present. No frontal sinus tenderness.     Mouth/Throat:     Pharynx: Uvula midline. Postnasal drip present. No oropharyngeal exudate or posterior oropharyngeal erythema.  Neck:     Thyroid: Thyroid mass and thyroid tenderness present.     Comments: Enlarged and tender left thyroid nodule. Cardiovascular:     Rate and Rhythm: Normal rate and regular rhythm.     Heart sounds: Normal heart sounds, S1 normal and S2 normal. No murmur heard. Pulmonary:     Effort: Pulmonary effort is normal.     Breath sounds: Normal breath sounds. No wheezing, rhonchi or rales.     Comments: Clear to auscultation bilaterally Lymphadenopathy:     Head:     Right side of head: No submental, submandibular or tonsillar adenopathy.     Left side of head: No submental, submandibular or tonsillar adenopathy.     Cervical: Cervical adenopathy present.     Right cervical: No superficial cervical adenopathy.    Left cervical: Superficial cervical adenopathy present.  Psychiatric:        Behavior: Behavior is cooperative.      UC Treatments / Results  Labs (all labs ordered are listed, but only abnormal results are displayed) Labs Reviewed  POCT RAPID STREP A (OFFICE) - Abnormal; Notable for the following components:      Result Value   Rapid Strep A Screen Positive (*)    All other components within normal  limits  POCT MONO SCREEN ([facility]) - Normal    EKG   Radiology No results found.  Procedures Procedures (including critical care time)  Medications Ordered in UC Medications - No data to display  Initial Impression / Assessment and Plan / UC Course  I have reviewed the triage vital signs and the nursing notes.  Pertinent labs & imaging results that  were available during my care of the patient were reviewed by me and considered in my medical decision making (see chart for details).     Patient is well-appearing, afebrile, nontoxic, nontachycardic.  Mono testing was negative.  Strep testing was positive.  She was started on amoxicillin 500 mg twice daily for 10 days.  Discussed that she is to dispose of her toothbrush a few days after starting medication.  She can gargle with warm salt water and alternate Tylenol ibuprofen for pain.  She is contagious for 24 hours after starting the medication so was given an excuse note.  We discussed that this is the likely cause of her lymphadenopathy and this should improve over the next several weeks.  I did recommend that she follow-up closely with her primary care to ensure resolution of lymphadenopathy and if anything worsens this enlarges and she has muffled voice, difficulty swallowing, high fever, shortness of breath she needs to be seen emergently.  Strict return precautions given.  Excuse note provided.   Final Clinical Impressions(s) / UC Diagnoses   Final diagnoses:  Strep pharyngitis  Lymphadenopathy     Discharge Instructions      You tested positive for strep pharyngitis.  Start amoxicillin twice daily as prescribed.  Use over-the-counter medications including Tylenol and ibuprofen for pain.  Gargle with warm salt water.  Throw your toothbrush a few days after starting medication to prevent reinfection.  Follow-up with primary care if symptoms do not improve significantly in the next couple of days.  You are contagious for 24 hours  after starting medication so I have provided an excuse note.  If you have any worsening symptoms including high fever, difficulty swallowing, swelling of your throat, shortness of breath, muffled voice you need to be seen immediately.      ED Prescriptions     Medication Sig Dispense Auth. Provider   amoxicillin (AMOXIL) 500 MG capsule Take 1 capsule (500 mg total) by mouth 2 (two) times daily. 20 capsule Shakerra Red, Noberto Retort, PA-C      PDMP not reviewed this encounter.   Jeani Hawking, PA-C 11/22/23 1601

## 2024-01-14 ENCOUNTER — Ambulatory Visit (INDEPENDENT_AMBULATORY_CARE_PROVIDER_SITE_OTHER): Payer: PPO | Admitting: Family

## 2024-01-14 VITALS — BP 122/76 | HR 80 | Temp 98.0°F | Resp 16 | Ht 59.0 in | Wt 112.0 lb

## 2024-01-14 DIAGNOSIS — Z130 Encounter for screening for diseases of the blood and blood-forming organs and certain disorders involving the immune mechanism: Secondary | ICD-10-CM

## 2024-01-14 DIAGNOSIS — Z13228 Encounter for screening for other metabolic disorders: Secondary | ICD-10-CM

## 2024-01-14 DIAGNOSIS — Z0000 Encounter for general adult medical examination without abnormal findings: Secondary | ICD-10-CM

## 2024-01-14 DIAGNOSIS — Z1329 Encounter for screening for other suspected endocrine disorder: Secondary | ICD-10-CM | POA: Diagnosis not present

## 2024-01-14 DIAGNOSIS — Z13220 Encounter for screening for lipoid disorders: Secondary | ICD-10-CM | POA: Diagnosis not present

## 2024-01-14 DIAGNOSIS — Z131 Encounter for screening for diabetes mellitus: Secondary | ICD-10-CM

## 2024-01-14 DIAGNOSIS — Z0182 Encounter for allergy testing: Secondary | ICD-10-CM

## 2024-01-14 NOTE — Progress Notes (Signed)
Patient ID: Carrie Morgan, female    DOB: 08/13/2001  MRN: [id number]  CC: Physical Exam  Subjective: Carrie Morgan is a 23 y.o. female who presents for physical exam.   Her concerns today include:  - Reports she recently got a new cat and thinks she may be allergic due to states sometimes she notices "small bumps" on her hands. Denies red flag symptoms. Requests referral to Allergy.  Patient Active Problem List   Diagnosis Date Noted   Neck pain, musculoskeletal 06/16/2020   Vitamin D deficiency 12/14/2016   Wears glasses 09/28/2014     No current outpatient medications on file prior to visit.   No current facility-administered medications on file prior to visit.    No Known Allergies  Social History   Socioeconomic History   Marital status: Single    Spouse name: Not on file   Number of children: Not on file   Years of education: Not on file   Highest education level: Not on file  Occupational History   Not on file  Tobacco Use   Smoking status: Never    Passive exposure: Never   Smokeless tobacco: Never  Vaping Use   Vaping status: Never Used  Substance and Sexual Activity   Alcohol use: No   Drug use: No   Sexual activity: Never  Other Topics Concern   Not on file  Social History Narrative   Lives with parents, 30 month old sister and uncle.  She is in the 7th grade.   Social Drivers of Corporate investment banker Strain: Not on file  Food Insecurity: No Food Insecurity (01/14/2024)   Hunger Vital Sign    Worried About Running Out of Food in the Last Year: Never true    Ran Out of Food in the Last Year: Never true  Transportation Needs: Not on file  Physical Activity: Not on file  Stress: Not on file  Social Connections: Unknown (04/10/2022)   Received from [facility], [facility]   Social Network    Social Network: Not on file  Intimate Partner Violence: Unknown (03/01/2022)   Received from [facility]   HITS    Physically Hurt: Not on file    Insult or Talk Down To: Not on file    Threaten Physical Harm: Not on file    Scream or Curse: Not on file    No family history on file.  Past Surgical History:  Procedure Laterality Date   INCISION AND DRAINAGE ABSCESS N/A 05/04/2014   Procedure: INCISION AND DRAINAGE RETROPHARYNGEAL ABSCESS;  Surgeon: Serena Colonel, MD;  Location: [facility] OR;  Service: ENT;  Laterality: N/A;    ROS: Review of Systems Negative except as stated above  PHYSICAL EXAM: BP 122/76   Pulse 80   Temp 98 F (36.7 C) (Oral)   Resp 16   Ht 4\' 11"  (1.499 m)   Wt 112 lb (50.8 kg)   SpO2 97%   BMI 22.62 kg/m   Physical Exam HENT:     Head: Normocephalic and atraumatic.     Right Ear: Tympanic membrane, ear canal and external ear normal.     Left Ear: Tympanic membrane, ear canal and external ear normal.     Nose: Nose normal.     Mouth/Throat:     Mouth: Mucous membranes are moist.     Pharynx: Oropharynx is clear.  Eyes:     Extraocular Movements: Extraocular movements intact.  Conjunctiva/sclera: Conjunctivae normal.     Pupils: Pupils are equal, round, and reactive to light.  Neck:     Thyroid: No thyroid mass, thyromegaly or thyroid tenderness.  Cardiovascular:     Rate and Rhythm: Normal rate and regular rhythm.     Pulses: Normal pulses.     Heart sounds: Normal heart sounds.  Pulmonary:     Effort: Pulmonary effort is normal.     Breath sounds: Normal breath sounds.  Chest:     Comments: Patient declined.  Abdominal:     General: Bowel sounds are normal.     Palpations: Abdomen is soft.  Genitourinary:    Comments: Patient declined.  Musculoskeletal:        General: Normal range of motion.     Right shoulder: Normal.     Left shoulder: Normal.     Right upper arm: Normal.     Left upper arm: Normal.     Right elbow: Normal.     Left elbow: Normal.     Right forearm: Normal.     Left forearm: Normal.     Right wrist: Normal.      Left wrist: Normal.     Right hand: Normal.     Left hand: Normal.     Cervical back: Normal, normal range of motion and neck supple.     Thoracic back: Normal.     Lumbar back: Normal.     Right hip: Normal.     Left hip: Normal.     Right upper leg: Normal.     Left upper leg: Normal.     Right knee: Normal.     Left knee: Normal.     Right lower leg: Normal.     Left lower leg: Normal.     Right ankle: Normal.     Left ankle: Normal.     Right foot: Normal.     Left foot: Normal.  Skin:    General: Skin is warm and dry.     Capillary Refill: Capillary refill takes less than 2 seconds.  Neurological:     General: No focal deficit present.     Mental Status: She is alert and oriented to person, place, and time.  Psychiatric:        Mood and Affect: Mood normal.        Behavior: Behavior normal.      ASSESSMENT AND PLAN: 1. Annual physical exam (Primary) - Counseled on 150 minutes of exercise per week as tolerated, healthy eating (including decreased daily intake of saturated fats, cholesterol, added sugars, sodium), STI prevention, and routine healthcare maintenance.  2. Screening for metabolic disorder - Routine screening.  - CMP14+EGFR  3. Screening for deficiency anemia - Routine screening.  - CBC  4. Diabetes mellitus screening - Routine screening.  - Hemoglobin A1c  5. Screening cholesterol level - Routine screening.  - Lipid panel  6. Thyroid disorder screen - Routine screening.  - TSH  7. Encounter for allergy testing - Routine screening.  - Referral to Allergy for evaluation/management. - Allergen, Cat Dander, e1 - Ambulatory referral to Allergy    Patient was given the opportunity to ask questions.  Patient verbalized understanding of the plan and was able to repeat key elements of the plan. Patient was given clear instructions to go to Emergency Department or return to medical center if symptoms don't improve, worsen, or new problems  develop.The patient verbalized understanding.   Orders Placed This Encounter  Procedures  CBC   Lipid panel   CMP14+EGFR   Hemoglobin A1c   TSH   Allergen, Cat Dander, e1   Ambulatory referral to Allergy    Return in about 1 year (around 01/13/2025) for Physical per patient preference.  Rema Fendt, NP

## 2024-01-14 NOTE — Progress Notes (Signed)
-  Patient is here to have annually  complete physical examination  -[facility] [place] address -labs taken  Patient would like a referral to allergist

## 2024-01-17 ENCOUNTER — Encounter: Payer: Self-pay | Admitting: Family

## 2024-01-17 LAB — CBC
Hematocrit: 40.4 % (ref 34.0–46.6)
Hemoglobin: 13.7 g/dL (ref 11.1–15.9)
MCH: 32.5 pg (ref 26.6–33.0)
MCHC: 33.9 g/dL (ref 31.5–35.7)
MCV: 96 fL (ref 79–97)
Platelets: 268 10*3/uL (ref 150–450)
RBC: 4.21 x10E6/uL (ref 3.77–5.28)
RDW: 13 % (ref 11.7–15.4)
WBC: 7.5 10*3/uL (ref 3.4–10.8)

## 2024-01-17 LAB — CMP14+EGFR
ALT: 11 [IU]/L (ref 0–32)
AST: 18 [IU]/L (ref 0–40)
Albumin: 4.6 g/dL (ref 4.0–5.0)
Alkaline Phosphatase: 102 [IU]/L (ref 44–121)
BUN/Creatinine Ratio: 11 (ref 9–23)
BUN: 7 mg/dL (ref 6–20)
Bilirubin Total: 0.2 mg/dL (ref 0.0–1.2)
CO2: 22 mmol/L (ref 20–29)
Calcium: 9.4 mg/dL (ref 8.7–10.2)
Chloride: 103 mmol/L (ref 96–106)
Creatinine, Ser: 0.63 mg/dL (ref 0.57–1.00)
Globulin, Total: 2.7 g/dL (ref 1.5–4.5)
Glucose: 98 mg/dL (ref 70–99)
Potassium: 4.1 mmol/L (ref 3.5–5.2)
Sodium: 141 mmol/L (ref 134–144)
Total Protein: 7.3 g/dL (ref 6.0–8.5)
eGFR: 129 mL/min/{1.73_m2} (ref >59)

## 2024-01-17 LAB — ALLERGEN, CAT DANDER, E1: Cat Dander IgE: 0.16 kU/L — AB

## 2024-01-17 LAB — LIPID PANEL
Chol/HDL Ratio: 2.9 {ratio} (ref 0.0–4.4)
Cholesterol, Total: 153 mg/dL (ref 100–199)
HDL: 53 mg/dL (ref >39)
LDL Chol Calc (NIH): 82 mg/dL (ref 0–99)
Triglycerides: 99 mg/dL (ref 0–149)
VLDL Cholesterol Cal: 18 mg/dL (ref 5–40)

## 2024-01-17 LAB — HEMOGLOBIN A1C
Est. average glucose Bld gHb Est-mCnc: 114 mg/dL
Hgb A1c MFr Bld: 5.6 % (ref 4.8–5.6)

## 2024-01-17 LAB — TSH: TSH: 1.72 u[IU]/mL (ref 0.450–4.500)

## 2025-01-13 ENCOUNTER — Encounter: Payer: Medicaid Other | Admitting: Family
